# Patient Record
Sex: Female | Born: 2000 | Hispanic: Yes | Marital: Single | Smoking: Never smoker
Health system: Southern US, Community
[De-identification: ages and names within clinical notes are randomized; demographics above are authoritative.]

## PROBLEM LIST (undated history)

## (undated) DIAGNOSIS — R05 Cough: Secondary | ICD-10-CM

## (undated) DIAGNOSIS — Z8489 Family history of other specified conditions: Secondary | ICD-10-CM

## (undated) DIAGNOSIS — R0989 Other specified symptoms and signs involving the circulatory and respiratory systems: Secondary | ICD-10-CM

## (undated) DIAGNOSIS — I10 Essential (primary) hypertension: Secondary | ICD-10-CM

## (undated) DIAGNOSIS — J351 Hypertrophy of tonsils: Secondary | ICD-10-CM

## (undated) HISTORY — PX: WISDOM TOOTH EXTRACTION: SHX21

## (undated) HISTORY — PX: TYMPANOSTOMY TUBE PLACEMENT: SHX32

---
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
## 2018-05-07 ENCOUNTER — Other Ambulatory Visit: Payer: Self-pay | Admitting: *Deleted

## 2018-05-07 MED ORDER — CETIRIZINE HCL 10 MG PO TABS: 10.0000 mg | ORAL_TABLET | Freq: Every day | ORAL | 3 refills | Status: AC

## 2018-06-16 ENCOUNTER — Telehealth: Payer: Self-pay | Admitting: Family Medicine

## 2018-06-16 MED ORDER — AMLODIPINE BESYLATE 10 MG PO TABS: 10.0000 mg | ORAL_TABLET | Freq: Every day | ORAL | 0 refills | Status: AC

## 2018-06-16 NOTE — Telephone Encounter (Signed)
Med sent to requested pharm for 30 days

## 2018-06-16 NOTE — Telephone Encounter (Signed)
[facility]  [phone] [place] [facility] [place] and needs one refill of the amlodipine called in to this pharmacy if possible  Cannot get into the new doctor until 3 weeks from now and she is out of this

## 2018-09-09 IMAGING — US US RENAL
1 series · 14 of 25 positions shown · non-contrast
Comparison: None.

CLINICAL DATA: Elevated blood pressure

EXAM:
RENAL/URINARY TRACT ULTRASOUND
RENAL DUPLEX DOPPLER ULTRASOUND

[Series 1: us renal · 0.30mm/px · 14 of 58 slices shown]
[im 1/58]
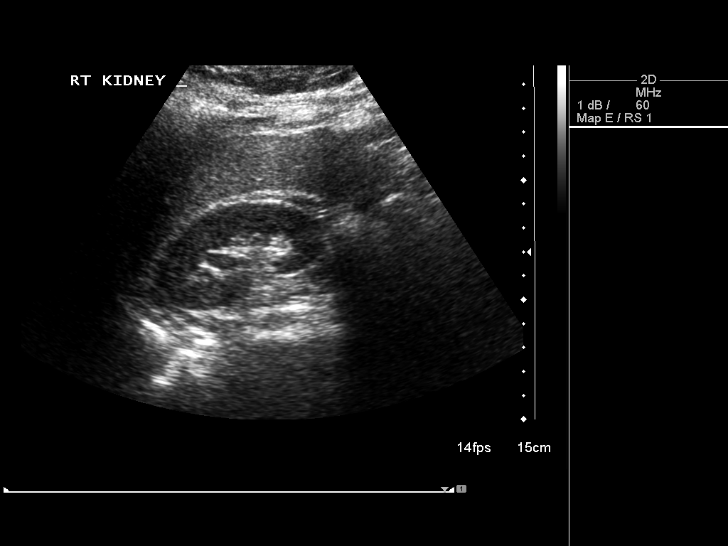
[im 5/58]
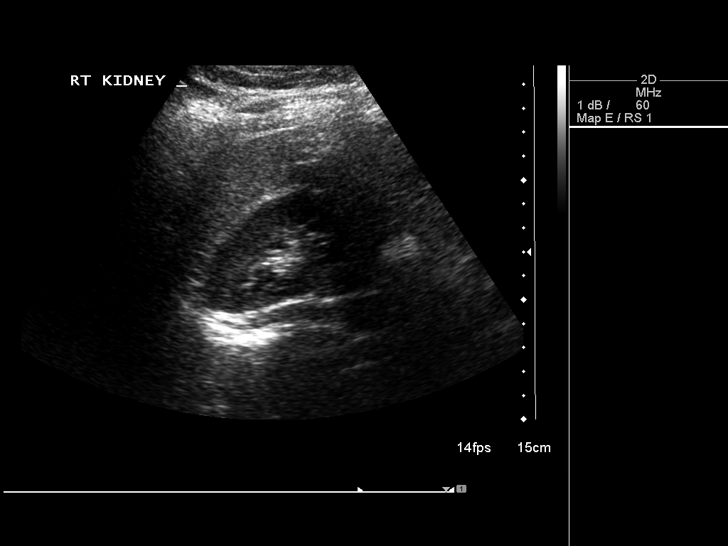
[im 10/58]
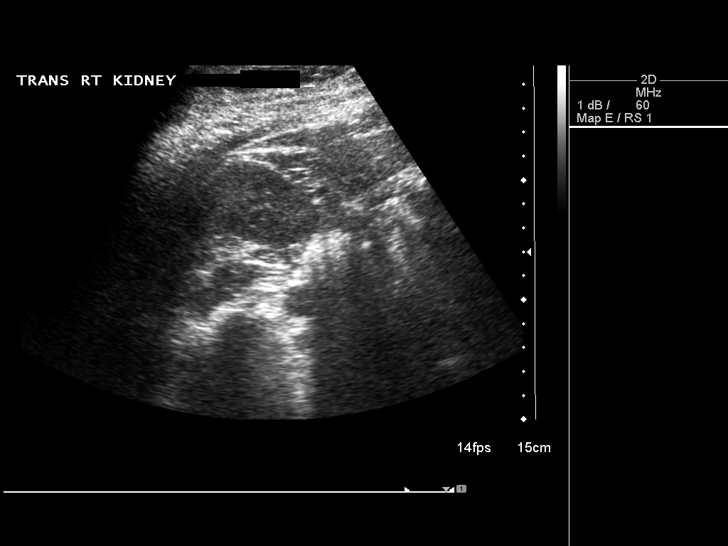
[im 15/58]
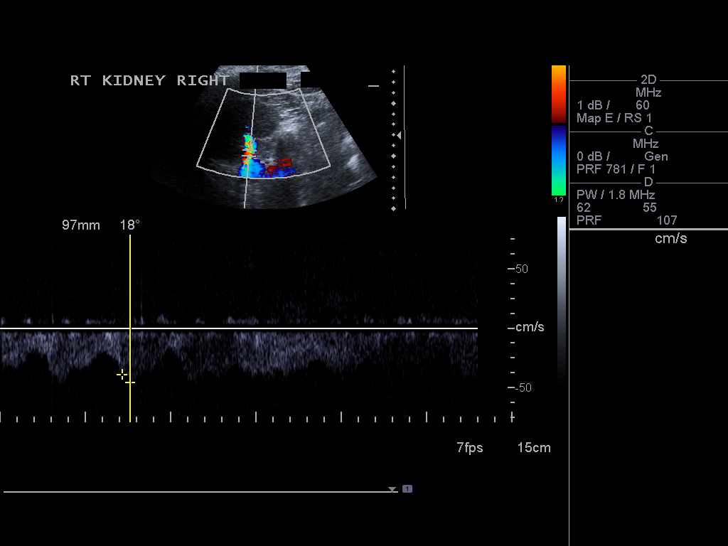
[im 20/58]
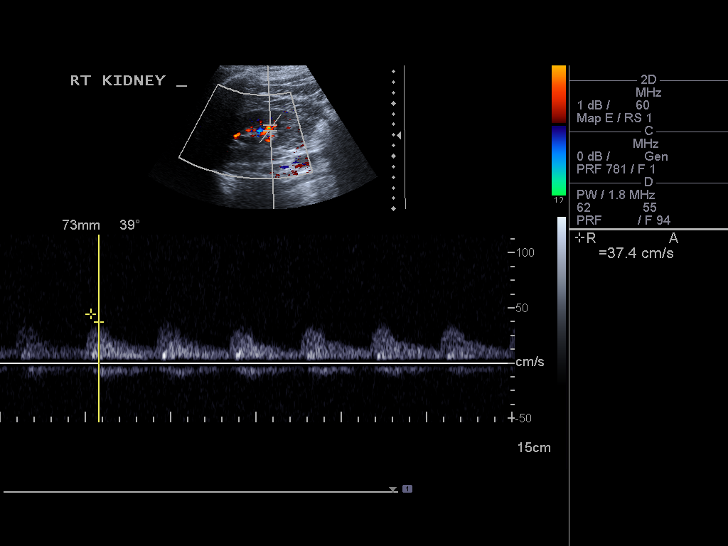
[im 22/58]
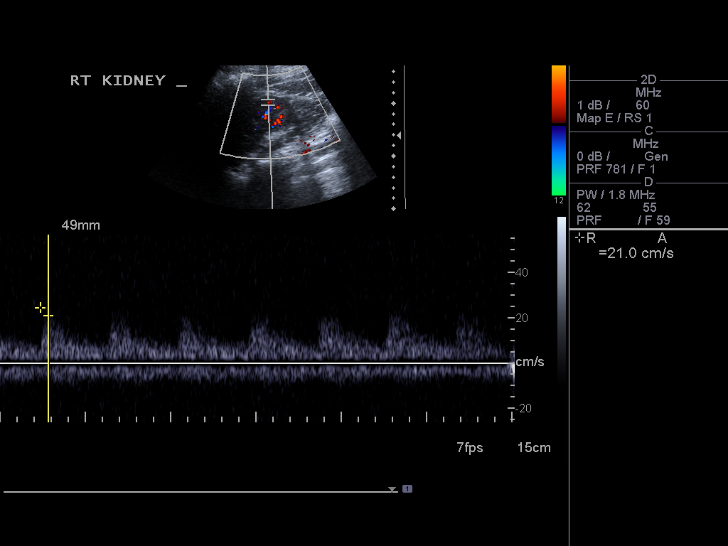
[im 27/58]
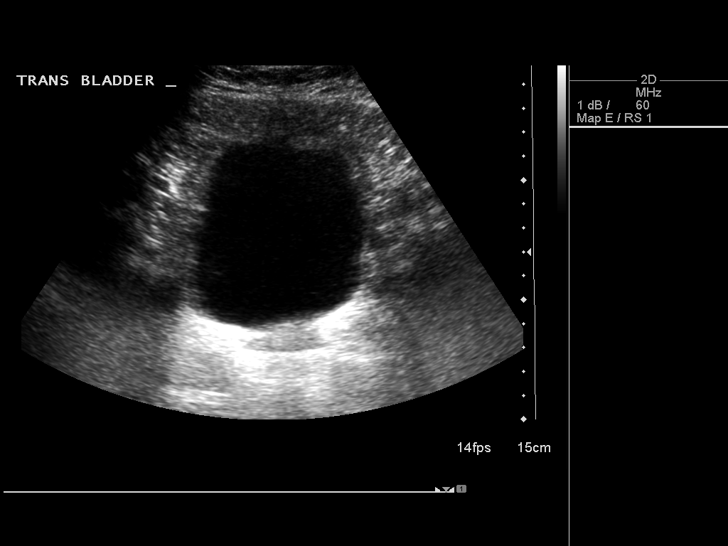
[im 31/58]
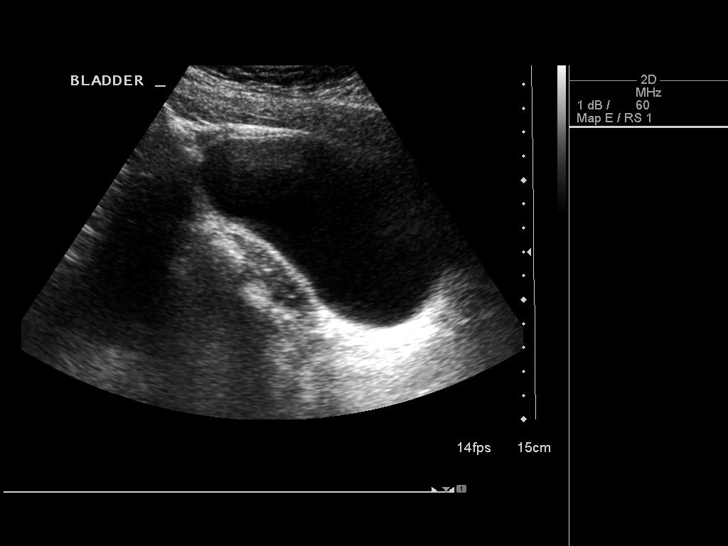
[im 36/58]
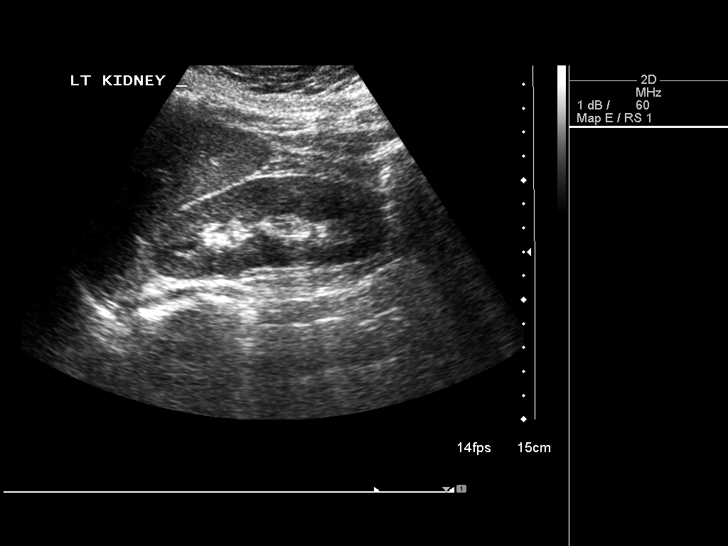
[im 39/58]
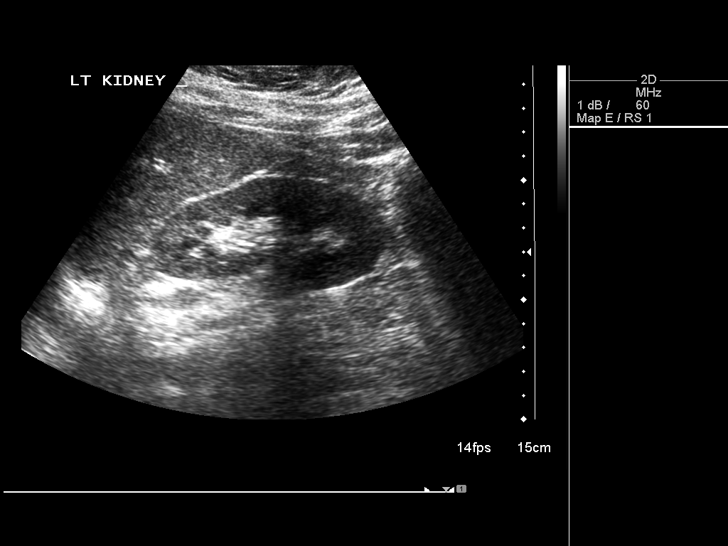
[im 43/58]
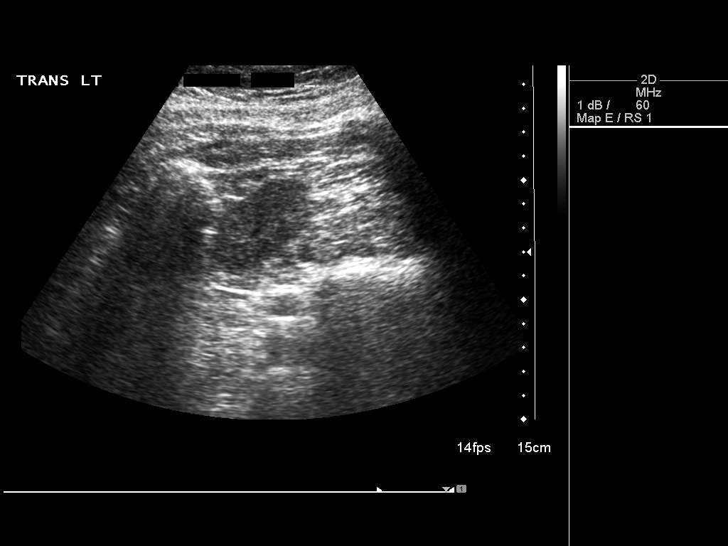
[im 48/58]
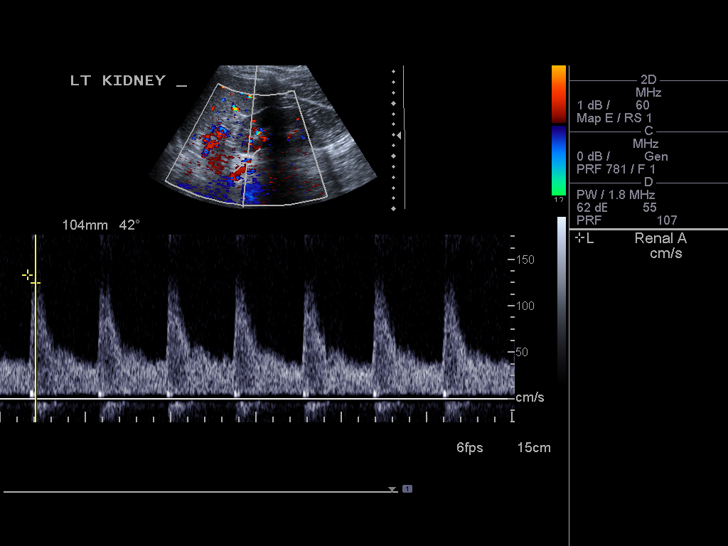
[im 53/58]
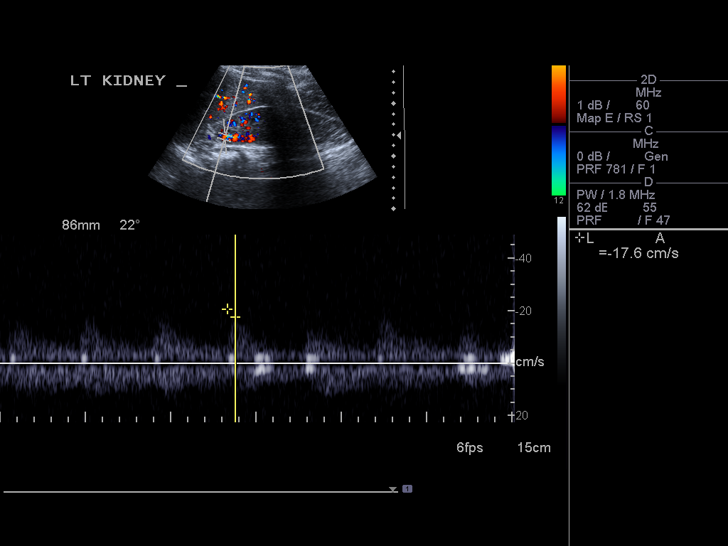
[im 58/58]
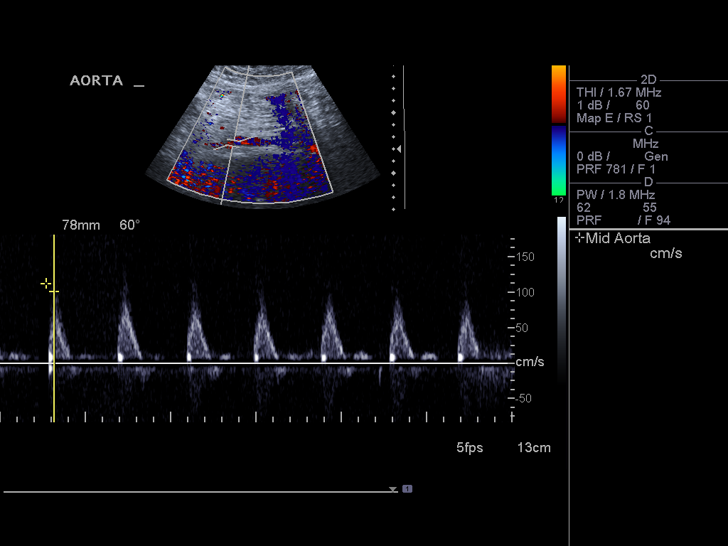

[14 of 25 positions shown; findings below may reference images not displayed]

FINDINGS: Right Kidney:

Length: 9.0 cm. Echogenicity within normal limits. No mass or
hydronephrosis visualized.

Left Kidney:

Length: 10.1 cm. Echogenicity within normal limits. No mass or
hydronephrosis visualized.

Normal renal size for a age is 10.5 +/- 0.6 cm.

Bladder:  Within normal limits.

RENAL DUPLEX ULTRASOUND

Right Renal Artery Velocities:

Origin:  123 cm/sec

Mid:  87 cm/sec

Hilum:  82 cm/sec

Interlobar:  45 cm/sec

Arcuate:  22 Cm/sec

Left Renal Artery Velocities:

Origin:  124 cm/sec

Mid:  91 cm/sec

Hilum:  89 cm/sec

Interlobar:  53 cm/sec

Arcuate:  20 cm/sec

Aortic Velocity:  103 Cm/sec

Right Renal-Aortic Ratios:

Origin:

Mid:

Hilum:

Interlobar:

Arcuate:

Left Renal-Aortic Ratios:

Origin:

Mid:

Hilum:

Interlobar:

Arcuate:

Bilateral renal veins are patent by Doppler imaging.
IMPRESSION: No evidence of significant renal artery stenosis

Right kidney is slightly small of unknown significance.
